# Patient Record
Sex: Female | Born: 1965 | Race: Black or African American | Hispanic: No | Marital: Single | Smoking: Never smoker
Health system: Southern US, Community
[De-identification: ages and names within clinical notes are randomized; demographics above are authoritative.]

## PROBLEM LIST (undated history)

## (undated) DIAGNOSIS — R42 Dizziness and giddiness: Secondary | ICD-10-CM

## (undated) DIAGNOSIS — R55 Syncope and collapse: Secondary | ICD-10-CM

## (undated) DIAGNOSIS — M542 Cervicalgia: Secondary | ICD-10-CM

## (undated) DIAGNOSIS — R1310 Dysphagia, unspecified: Secondary | ICD-10-CM

## (undated) DIAGNOSIS — R109 Unspecified abdominal pain: Secondary | ICD-10-CM

## (undated) DIAGNOSIS — R61 Generalized hyperhidrosis: Secondary | ICD-10-CM

## (undated) DIAGNOSIS — R0600 Dyspnea, unspecified: Secondary | ICD-10-CM

## (undated) DIAGNOSIS — H538 Other visual disturbances: Secondary | ICD-10-CM

## (undated) DIAGNOSIS — U071 COVID-19: Secondary | ICD-10-CM

## (undated) DIAGNOSIS — R002 Palpitations: Secondary | ICD-10-CM

## (undated) DIAGNOSIS — R457 State of emotional shock and stress, unspecified: Secondary | ICD-10-CM

## (undated) DIAGNOSIS — J45909 Unspecified asthma, uncomplicated: Secondary | ICD-10-CM

## (undated) HISTORY — DX: Dyspnea, unspecified: R06.00

## (undated) HISTORY — DX: Generalized hyperhidrosis: R61

## (undated) HISTORY — DX: Unspecified asthma, uncomplicated: J45.909

## (undated) HISTORY — DX: Unspecified abdominal pain: R10.9

## (undated) HISTORY — DX: Syncope and collapse: R55

## (undated) HISTORY — DX: Dysphagia, unspecified: R13.10

## (undated) HISTORY — DX: State of emotional shock and stress, unspecified: R45.7

## (undated) HISTORY — DX: Palpitations: R00.2

## (undated) HISTORY — DX: Cervicalgia: M54.2

## (undated) HISTORY — DX: COVID-19: U07.1

## (undated) HISTORY — DX: Dizziness and giddiness: R42

## (undated) HISTORY — DX: Other visual disturbances: H53.8

---

## 2013-09-29 DIAGNOSIS — N3281 Overactive bladder: Secondary | ICD-10-CM | POA: Insufficient documentation

## 2019-10-05 DIAGNOSIS — R87810 Cervical high risk human papillomavirus (HPV) DNA test positive: Secondary | ICD-10-CM | POA: Insufficient documentation

## 2020-07-18 NOTE — Progress Notes (Deleted)
Cardiology Office Note:    Date:  07/18/2020   ID:  Heidi Mann, DOB July 10, 1965, MRN [id number]  PCP:  No primary care provider on file.   [facility]  Cardiologist:  No primary care provider on file. *** Advanced Practice Provider:  No care team member to display Electrophysiologist:  None    Referring MD: Burnice Logan, PA     History of Present Illness:    Heidi Mann is a 55 y.o. 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  No past medical history on file.  *** The histories are not reviewed yet. Please review them in the "History" navigator section and refresh this SmartLink.  Current Medications: No outpatient medications have been marked as taking for the 07/20/20 encounter (Appointment) with Meriam Sprague, MD.     Allergies:   Patient has no allergy information on record.   Social History   Socioeconomic History  . Marital status: Not on file    Spouse name: Not on file  . Number of children: Not on file  . Years of education: Not on file  . Highest education level: Not on file  Occupational History  . Not on file  Tobacco Use  . Smoking status: Not on file  . Smokeless tobacco: Not on file  Substance and Sexual Activity  . Alcohol use: Not on file  . Drug use: Not on file  . Sexual activity: Not on file  Other Topics Concern  . Not on file  Social History Narrative  . Not on file   Social Determinants of Health   Financial Resource Strain: Not on file  Food Insecurity: Not on file  Transportation Needs: Not on file  Physical Activity: Not on file  Stress: Not on file  Social Connections: Not on file     Family History: The patient's ***family history is not on file.  ROS:   Please see the history of present illness.    *** All other systems reviewed and are negative.  EKGs/Labs/Other Studies Reviewed:    The following studies were reviewed  today: ***  EKG:  EKG is *** ordered today.  The ekg ordered today demonstrates ***  Recent Labs: No results found for requested labs within last 8760 hours.  Recent Lipid Panel No results found for: CHOL, TRIG, HDL, CHOLHDL, VLDL, LDLCALC, LDLDIRECT   Risk Assessment/Calculations:   {Does this patient have ATRIAL FIBRILLATION?[phone]}   Physical Exam:    VS:  There were no vitals taken for this visit.    Wt Readings from Last 3 Encounters:  No data found for Wt     GEN: *** Well nourished, well developed in no acute distress HEENT: Normal NECK: No JVD; No carotid bruits LYMPHATICS: No lymphadenopathy CARDIAC: ***RRR, no murmurs, rubs, gallops RESPIRATORY:  Clear to auscultation without rales, wheezing or rhonchi  ABDOMEN: Soft, non-tender, non-distended MUSCULOSKELETAL:  No edema; No deformity  SKIN: Warm and dry NEUROLOGIC:  Alert and oriented x 3 PSYCHIATRIC:  Normal affect   ASSESSMENT:    No diagnosis found. PLAN:    In order of problems listed above:  #Aortic Ectasia:   {Are you ordering a CV Procedure (e.g. stress test, cath, DCCV, TEE, etc)?   Press F2        :[id number]}    Medication Adjustments/Labs and Tests Ordered: Current medicines are reviewed at length with the patient today.  Concerns regarding medicines are  outlined above.  No orders of the defined types were placed in this encounter.  No orders of the defined types were placed in this encounter.   There are no Patient Instructions on file for this visit.   Signed, Meriam Sprague, MD  07/18/2020 5:40 PM    [facility]

## 2020-07-20 ENCOUNTER — Ambulatory Visit: Payer: 59 | Admitting: Cardiology

## 2020-08-01 ENCOUNTER — Telehealth: Payer: Self-pay

## 2020-08-01 NOTE — Progress Notes (Deleted)
Cardiology Office Note:    Date:  08/01/2020   ID:  Heidi Mann, DOB 08-Mar-1966, MRN [id number]  PCP:  No primary care provider on file.   [facility]  Cardiologist:  No primary care provider on file. *** Advanced Practice Provider:  No care team member to display Electrophysiologist:  None  {Press F2 to show EP APP, CHF, sleep or structural heart MD               :[id number]}  { Click here to update then REFRESH NOTE - MD (PCP) or APP (Team Member)  Change PCP Type for MD, Specialty for APP is either Cardiology or Clinical Cardiac Electrophysiology  :[id number]}   Referring MD: Burnice Logan, PA   No chief complaint on file. ***  History of Present Illness:    Heidi Mann is a 55 y.o. 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  No past medical history on file.  *** The histories are not reviewed yet. Please review them in the "History" navigator section and refresh this SmartLink.  Current Medications: No outpatient medications have been marked as taking for the 08/03/20 encounter (Appointment) with Meriam Sprague, MD.     Allergies:   Patient has no allergy information on record.   Social History   Socioeconomic History  . Marital status: Not on file    Spouse name: Not on file  . Number of children: Not on file  . Years of education: Not on file  . Highest education level: Not on file  Occupational History  . Not on file  Tobacco Use  . Smoking status: Not on file  . Smokeless tobacco: Not on file  Substance and Sexual Activity  . Alcohol use: Not on file  . Drug use: Not on file  . Sexual activity: Not on file  Other Topics Concern  . Not on file  Social History Narrative  . Not on file   Social Determinants of Health   Financial Resource Strain: Not on file  Food Insecurity: Not on file  Transportation Needs: Not on file  Physical Activity: Not on file  Stress: Not  on file  Social Connections: Not on file     Family History: The patient's ***family history is not on file.  ROS:   Please see the history of present illness.    *** All other systems reviewed and are negative.  EKGs/Labs/Other Studies Reviewed:    The following studies were reviewed today: ***  EKG:  EKG is *** ordered today.  The ekg ordered today demonstrates ***  Recent Labs: No results found for requested labs within last 8760 hours.  Recent Lipid Panel No results found for: CHOL, TRIG, HDL, CHOLHDL, VLDL, LDLCALC, LDLDIRECT   Risk Assessment/Calculations:   {Does this patient have ATRIAL FIBRILLATION?:[phone]}   Physical Exam:    VS:  There were no vitals taken for this visit.    Wt Readings from Last 3 Encounters:  No data found for Wt     GEN: *** Well nourished, well developed in no acute distress HEENT: Normal NECK: No JVD; No carotid bruits LYMPHATICS: No lymphadenopathy CARDIAC: ***RRR, no murmurs, rubs, gallops RESPIRATORY:  Clear to auscultation without rales, wheezing or rhonchi  ABDOMEN: Soft, non-tender, non-distended MUSCULOSKELETAL:  No edema; No deformity  SKIN: Warm and dry NEUROLOGIC:  Alert and oriented x 3 PSYCHIATRIC:  Normal affect   ASSESSMENT:  No diagnosis found. PLAN:    In order of problems listed above:   #Aortic Ectasia:   {Are you ordering a CV Procedure (e.g. stress test, cath, DCCV, TEE, etc)?   Press F2        :[id number]}    Medication Adjustments/Labs and Tests Ordered: Current medicines are reviewed at length with the patient today.  Concerns regarding medicines are outlined above.  No orders of the defined types were placed in this encounter.  No orders of the defined types were placed in this encounter.   There are no Patient Instructions on file for this visit.   Signed, Meriam Sprague, MD  08/01/2020 8:38 PM    [facility]

## 2020-08-03 ENCOUNTER — Ambulatory Visit: Payer: 59 | Admitting: Cardiology

## 2020-08-06 NOTE — Telephone Encounter (Signed)
ERROR

## 2020-09-01 NOTE — Progress Notes (Deleted)
Cardiology Office Note:    Date:  09/01/2020   ID:  [name], DOB 1966/02/21, MRN [id number]  PCP:  No primary care provider on file.   [facility]  Cardiologist:  No primary care provider on file.  Advanced Practice Provider:  No care team member to display Electrophysiologist:  None    Referring MD: Burnice Logan, PA     History of Present Illness:    Heidi Mann is a 55 y.o. female with a hx of asthma who was referred by Burnice Logan, PA for evaluation of chest pain.  The patient was seen by PCP on 06/18/20 where she was complaining of substernal chest pressure that feels like indigestion. This had been ongoing since suffering from COVID in 04/2020. Symptoms were nonexertional. CXR obtained showed normal cardiac silhouette, but noted moderate aortic ectasia. TTE 06/06/20 showed LVEF 70%, normal LV wall thickness, normal aortic root size of 2.9cm, normal RV size. Coronary calcium score 0.   Past Medical History:  Diagnosis Date  . Abdominal pain   . Asthma   . Blurred vision   . COVID   . Diaphoresis   . Dizziness   . Dysphagia   . Dyspnea   . Emotional stress   . Neck pain   . Palpitations   . Syncope     *** The histories are not reviewed yet. Please review them in the "History" navigator section and refresh this SmartLink.  Current Medications: No outpatient medications have been marked as taking for the 09/07/20 encounter (Appointment) with Meriam Sprague, MD.     Allergies:   Patient has no known allergies.   Social History   Socioeconomic History  . Marital status: Not on file    Spouse name: Not on file  . Number of children: Not on file  . Years of education: Not on file  . Highest education level: Not on file  Occupational History  . Not on file  Tobacco Use  . Smoking status: Never Smoker  . Smokeless tobacco: Never Used  Substance and Sexual Activity  . Alcohol use: Yes  . Drug use: Never  . Sexual activity:  Not on file  Other Topics Concern  . Not on file  Social History Narrative  . Not on file   Social Determinants of Health   Financial Resource Strain: Not on file  Food Insecurity: Not on file  Transportation Needs: Not on file  Physical Activity: Not on file  Stress: Not on file  Social Connections: Not on file     Family History: The patient's ***family history includes CVA in her mother; Diabetes in her mother; Heart attack in her mother; Hyperlipidemia in her mother; Hypertension in her mother.  ROS:   Please see the history of present illness.    *** All other systems reviewed and are negative.  EKGs/Labs/Other Studies Reviewed:    The following studies were reviewed today: ***  EKG:  EKG is *** ordered today.  The ekg ordered today demonstrates ***  Recent Labs: No results found for requested labs within last 8760 hours.  Recent Lipid Panel No results found for: CHOL, TRIG, HDL, CHOLHDL, VLDL, LDLCALC, LDLDIRECT   Risk Assessment/Calculations:   {Does this patient have ATRIAL FIBRILLATION?[phone]}   Physical Exam:    VS:  There were no vitals taken for this visit.    Wt Readings from Last 3 Encounters:  No data found for Wt     GEN: *** Well nourished,  well developed in no acute distress HEENT: Normal NECK: No JVD; No carotid bruits LYMPHATICS: No lymphadenopathy CARDIAC: ***RRR, no murmurs, rubs, gallops RESPIRATORY:  Clear to auscultation without rales, wheezing or rhonchi  ABDOMEN: Soft, non-tender, non-distended MUSCULOSKELETAL:  No edema; No deformity  SKIN: Warm and dry NEUROLOGIC:  Alert and oriented x 3 PSYCHIATRIC:  Normal affect   ASSESSMENT:    No diagnosis found. PLAN:    In order of problems listed above:  #Aortic Ectasia: Noted on CXR. TTE at OSH with normal LVEF, no significant valve disease, normal aortic root measuring 2.9cm. Calcium score 0 on CT. -Check CTA of aorta  #Chest Pain: Likely secondary to recent COVID  infection in 04/2020. TTE with normal BiV function and calcium score 0. Do not suspect cardiac etiology of symptoms. -Continue symptomatic management  {Are you ordering a CV Procedure (e.g. stress test, cath, DCCV, TEE, etc)?   Press F2        :[id number]}    Medication Adjustments/Labs and Tests Ordered: Current medicines are reviewed at length with the patient today.  Concerns regarding medicines are outlined above.  No orders of the defined types were placed in this encounter.  No orders of the defined types were placed in this encounter.   There are no Patient Instructions on file for this visit.   Signed, Meriam Sprague, MD  09/01/2020 9:06 PM    [facility]

## 2020-09-07 ENCOUNTER — Encounter: Payer: Self-pay | Admitting: Cardiology

## 2020-09-07 ENCOUNTER — Other Ambulatory Visit: Payer: Self-pay

## 2020-09-07 ENCOUNTER — Ambulatory Visit: Payer: 59 | Admitting: Cardiology

## 2020-09-07 VITALS — BP 142/88 | HR 78 | Ht 65.0 in | Wt 178.4 lb

## 2020-09-07 DIAGNOSIS — E785 Hyperlipidemia, unspecified: Secondary | ICD-10-CM | POA: Diagnosis not present

## 2020-09-07 DIAGNOSIS — I10 Essential (primary) hypertension: Secondary | ICD-10-CM

## 2020-09-07 DIAGNOSIS — R29898 Other symptoms and signs involving the musculoskeletal system: Secondary | ICD-10-CM | POA: Diagnosis not present

## 2020-09-07 DIAGNOSIS — Z0189 Encounter for other specified special examinations: Secondary | ICD-10-CM | POA: Diagnosis not present

## 2020-09-07 DIAGNOSIS — I77819 Aortic ectasia, unspecified site: Secondary | ICD-10-CM | POA: Diagnosis not present

## 2020-09-07 LAB — BASIC METABOLIC PANEL
BUN/Creatinine Ratio: 18 (ref 9–23)
BUN: 17 mg/dL (ref 6–24)
CO2: 25 mmol/L (ref 20–29)
Calcium: 10.3 mg/dL — ABNORMAL HIGH (ref 8.7–10.2)
Chloride: 103 mmol/L (ref 96–106)
Creatinine, Ser: 0.97 mg/dL (ref 0.57–1.00)
Glucose: 96 mg/dL (ref 65–99)
Potassium: 4.4 mmol/L (ref 3.5–5.2)
Sodium: 143 mmol/L (ref 134–144)
eGFR: 70 mL/min/{1.73_m2} (ref >59)

## 2020-09-07 MED ORDER — ATORVASTATIN CALCIUM 10 MG PO TABS: 10.0000 mg | ORAL_TABLET | Freq: Every day | ORAL | 2 refills | Status: AC

## 2020-09-07 NOTE — Progress Notes (Signed)
Cardiology Office Note:    Date:  09/07/2020   ID:  Heidi Mann, DOB 1965/10/06, MRN [id number]  PCP:  No primary care provider on file.   [facility]  Cardiologist:  No primary care provider on file.  Advanced Practice Provider:  No care team member to display Electrophysiologist:  None     Referring MD: Burnice Logan, PA     History of Present Illness:    Heidi Mann is a 55 y.o. female with a hx of asthma who was referred by Burnice Logan, PA for evaluation of aortic ectasia noted on CXR.  The patient was seen by PCP on 06/18/20 where she was complaining of substernal chest pressure that feels like indigestion. This had been ongoing since suffering from COVID in 04/2020. Symptoms were nonexertional. CXR obtained showed normal cardiac silhouette, but noted moderate aortic ectasia. TTE 06/06/20 showed LVEF 70%, normal LV wall thickness, normal aortic root size of 2.9cm, normal RV size. Coronary calcium score 0.   Today, she presents with constant chest tightness and discomfort across her upper chest which has been ongoing since COVID. Her cardiac work-up has been reassuring as detailed above and her symptoms continue to be constant and non-exertional in nature. She is concerned about the aortic ectasia noted on CXR as detailed above. Denies any dyspnea, lightheadedness, syncope, lower extremity edema, or palpitations. No orthopnea or PND. No family history of aortopathies or dissections. Blood pressure well controlled at home.  The patient is also concerned that about 3-4 months ago, she had an episode of profound left arm weakness while sitting and eating at a restaurant such that she could not hoist herself out of the booth. No associated symptoms or tingling in the arm. No prior personal history of stroke. Her friend offered to call EMS, but she declined. She sat in the booth for about after which the symptoms resolved and have no recurred. She is  worried about possible TIA given her mother had a stroke several years back.  Family History: Her mother had high blood pressure and a history of stroke. She died approximately 6 years ago unsure of cause. Her brother also has high blood pressure.  Past Medical History:  Diagnosis Date  . Abdominal pain   . Asthma   . Blurred vision   . COVID   . Diaphoresis   . Dizziness   . Dysphagia   . Dyspnea   . Emotional stress   . Neck pain   . Palpitations   . Syncope     History reviewed. No pertinent surgical history.  Current Medications: Current Meds  Medication Sig  . ascorbic acid (VITAMIN C) 250 MG CHEW Chew 250 mg by mouth daily.  . cyclobenzaprine (FLEXERIL) 5 MG tablet Take 5 mg by mouth 3 (three) times daily as needed for muscle spasms.  . hydrocortisone 2.5 % cream Apply topically.  Marland Kitchen lisinopril (ZESTRIL) 5 MG tablet Take 5 mg by mouth daily.  . montelukast (SINGULAIR) 10 MG tablet   . SYMBICORT 160-4.5 MCG/ACT inhaler SMARTSIG:2 Puff(s) By Mouth Twice Daily  . Vitamin D, Ergocalciferol, (DRISDOL) 1.25 MG (50000 UNIT) CAPS capsule Take 50,000 Units by mouth every 7 (seven) days.  . [DISCONTINUED] atorvastatin (LIPITOR) 10 MG tablet Take 10 mg by mouth daily.     Allergies:   Iodine   Social History   Socioeconomic History  . Marital status: Unknown    Spouse name: Not on file  . Number of children: Not  on file  . Years of education: Not on file  . Highest education level: Not on file  Occupational History  . Not on file  Tobacco Use  . Smoking status: Never Smoker  . Smokeless tobacco: Never Used  Substance and Sexual Activity  . Alcohol use: Yes  . Drug use: Never  . Sexual activity: Not on file  Other Topics Concern  . Not on file  Social History Narrative  . Not on file   Social Determinants of Health   Financial Resource Strain: Not on file  Food Insecurity: Not on file  Transportation Needs: Not on file  Physical Activity: Not on file   Stress: Not on file  Social Connections: Not on file     Family History: The patient's family history includes CVA in her mother; Diabetes in her mother; Heart attack in her mother; Hyperlipidemia in her mother; Hypertension in her mother.  ROS:   Please see the history of present illness.    Review of Systems  Constitutional: Negative for malaise/fatigue.  HENT: Negative for hearing loss.   Eyes: Negative for blurred vision.  Respiratory: Negative for shortness of breath.   Cardiovascular: Positive for chest pain.  Gastrointestinal: Negative for heartburn.  Genitourinary: Negative.  Negative for dysuria.  Musculoskeletal: Negative for myalgias.  Neurological: Positive for weakness. Negative for dizziness.  Psychiatric/Behavioral: The patient is nervous/anxious.      EKGs/Labs/Other Studies Reviewed:    The following studies were reviewed today: OSH records reviewed: CXR obtained showed normal cardiac silhouette, but noted moderate aortic ectasia. TTE 06/06/20 showed LVEF 70%, normal LV wall thickness, normal aortic root size of 2.9cm, normal RV size. Coronary calcium score 0.   EKG:   09/07/20: NSR, Rate: 78 bpm, t wave flattening v4-v6, TWI III, aVF   Recent Labs: No results found for requested labs within last 8760 hours.  Recent Lipid Panel No results found for: CHOL, TRIG, HDL, CHOLHDL, VLDL, LDLCALC, LDLDIRECT    Physical Exam:    VS:  BP (!) 142/88   Pulse 78   Ht 5\' 5"  (1.651 m)   Wt 178 lb 6.4 oz (80.9 kg)   SpO2 96%   BMI 29.69 kg/m     Wt Readings from Last 3 Encounters:  09/07/20 178 lb 6.4 oz (80.9 kg)     GEN: Well nourished, well developed in no acute distress HEENT: Normal NECK: No JVD; No carotid bruits CARDIAC: RRR, no murmurs, rubs, gallops RESPIRATORY:  Clear to auscultation without rales, wheezing or rhonchi  ABDOMEN: Soft, non-tender, non-distended MUSCULOSKELETAL:  No edema; No deformity  SKIN: Warm and dry NEUROLOGIC:  Alert and  oriented x 3 PSYCHIATRIC:  Normal affect   ASSESSMENT:    1. Ectatic aorta (HCC)   2. Left arm weakness   3. Encounter for laboratory examination   4. Hyperlipidemia, unspecified hyperlipidemia type   5. Primary hypertension    PLAN:    In order of problems listed above:  #Aortic Ectasia: Noted on CXR. TTE at OSH with normal LVEF, no significant valve disease, normal aortic root measuring 2.9cm. Calcium score 0 on CT. Will check MRA chest for further evaluation. -Check MRA of aorta -Continue blood pressure control  #Chest Pain: #Prolonged COVID syndrome: Likely secondary to recent COVID infection in 04/2020. TTE with normal BiV function and calcium score 0. Do not suspect cardiac etiology of symptoms. -Continue symptomatic management of prolonged COVID syndrome  #Transient left arm weakness: Patient with episode of transient left arm weakness  when trying to boost herself out of a booth. She had to wait before symptoms resolved. Did not see physician at that time. Symptoms have not recurred. No known prior CVA or TIA but patient is concerned this may have been a TIA given her family history of stroke in her mom. Will refer to Neuro per patient request. -Referral to Neuro per patient request -Continue lipitor -Cardiac work-up reassuring with normal echo and coronary calcium score  #HTN: Well controlled at home. -Continue lisinopril 5mg  daily  #HLD: -Continue lipitor 10mg  daily -Managed by PCP    Medication Adjustments/Labs and Tests Ordered: Current medicines are reviewed at length with the patient today.  Concerns regarding medicines are outlined above.  Orders Placed This Encounter  Procedures  . MR Angiogram Chest W Wo Contrast  . Basic metabolic panel  . Ambulatory referral to Neurology  . EKG 12-Lead   Meds ordered this encounter  Medications  . atorvastatin (LIPITOR) 10 MG tablet    Sig: Take 1 tablet (10 mg total) by mouth daily.    Dispense:  90  tablet    Refill:  2    Patient Instructions  Medication Instructions:   Your physician recommends that you continue on your current medications as directed. Please refer to the Current Medication list given to you today.  *If you need a refill on your cardiac medications before your next appointment, please call your pharmacy*   Lab Work:  TODAY--BMET  If you have labs (blood work) drawn today and your tests are completely normal, you will receive your results only by: MyChart Message (if you have MyChart) OR . A paper copy in the mail If you have any lab test that is abnormal or we need to change your treatment, we will call you to review the results.    You have been referred to [facility] NEUROLOGY FOR TRANSIENT LEFT ARM WEAKNESS    Testing/Procedures:  MRA OF THE CHEST WITH/WITHOUT CONTRAST TO ASSESS ECTATIC AORTA   Follow-Up:  AS NEEDED IN THE OFFICE WITH DR. Marland Kitchen       Follow-up as needed.  I,Mathew Stumpf,acting as a [facility] for Shari Prows, MD.,have documented all relevant documentation on the behalf of Neurosurgeon, MD,as directed by  Meriam Sprague, MD while in the presence of Meriam Sprague, MD.  I, Meriam Sprague, MD, have reviewed all documentation for this visit. The documentation on 09/07/20 for the exam, diagnosis, procedures, and orders are all accurate and complete.  Signed, Meriam Sprague, MD  09/07/2020 10:18 AM    [facility]

## 2020-09-07 NOTE — Patient Instructions (Signed)
Medication Instructions:   Your physician recommends that you continue on your current medications as directed. Please refer to the Current Medication list given to you today.  *If you need a refill on your cardiac medications before your next appointment, please call your pharmacy*   Lab Work:  TODAY--BMET  If you have labs (blood work) drawn today and your tests are completely normal, you will receive your results only by: Marland Kitchen MyChart Message (if you have MyChart) OR . A paper copy in the mail If you have any lab test that is abnormal or we need to change your treatment, we will call you to review the results.    You have been referred to [facility] NEUROLOGY FOR TRANSIENT LEFT ARM WEAKNESS    Testing/Procedures:  MRA OF THE CHEST WITH/WITHOUT CONTRAST TO ASSESS ECTATIC AORTA   Follow-Up:  AS NEEDED IN THE OFFICE WITH DR. Shari Prows

## 2020-09-11 ENCOUNTER — Encounter: Payer: Self-pay | Admitting: Neurology

## 2020-09-21 ENCOUNTER — Ambulatory Visit (HOSPITAL_COMMUNITY): Payer: 59

## 2020-10-08 ENCOUNTER — Ambulatory Visit (HOSPITAL_COMMUNITY): Admission: RE | Admit: 2020-10-08 | Payer: 59 | Source: Ambulatory Visit

## 2020-11-26 ENCOUNTER — Ambulatory Visit: Payer: 59 | Admitting: Neurology
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
## 2021-01-03 ENCOUNTER — Encounter: Payer: Self-pay | Admitting: Neurology

## 2021-01-03 ENCOUNTER — Other Ambulatory Visit: Payer: Self-pay

## 2021-01-03 ENCOUNTER — Ambulatory Visit: Payer: 59 | Admitting: Neurology

## 2021-01-03 VITALS — BP 163/93 | HR 80 | Ht 65.0 in | Wt 185.0 lb

## 2021-01-03 DIAGNOSIS — R29898 Other symptoms and signs involving the musculoskeletal system: Secondary | ICD-10-CM | POA: Diagnosis not present

## 2021-01-03 DIAGNOSIS — R29818 Other symptoms and signs involving the nervous system: Secondary | ICD-10-CM

## 2021-01-03 NOTE — Patient Instructions (Addendum)
MRI brain without contrast MRA head US carotids  We will call with the results

## 2021-01-14 ENCOUNTER — Ambulatory Visit
Admission: RE | Admit: 2021-01-14 | Discharge: 2021-01-14 | Disposition: A | Discharge status: Home / Self Care | Payer: 59 | Source: Ambulatory Visit | Attending: Neurology | Admitting: Neurology

## 2021-01-14 ENCOUNTER — Other Ambulatory Visit: Payer: Self-pay

## 2021-01-14 DIAGNOSIS — R29818 Other symptoms and signs involving the nervous system: Secondary | ICD-10-CM

## 2021-01-14 DIAGNOSIS — R29898 Other symptoms and signs involving the musculoskeletal system: Secondary | ICD-10-CM
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
## 2021-01-14 IMAGING — US US CAROTID DUPLEX BILAT
1 series · 13 of 24 positions shown · non-contrast
Comparison: None.

CLINICAL DATA: Transient neurological symptoms, arm

EXAM:
BILATERAL CAROTID DUPLEX ULTRASOUND
TECHNIQUE: Gray scale imaging, color Doppler and duplex ultrasound were
performed of bilateral carotid and vertebral arteries in the neck.

[Series 1: us carotid duplex bilat · 0.06mm/px · 13 of 62 slices shown]
[im 1/62]
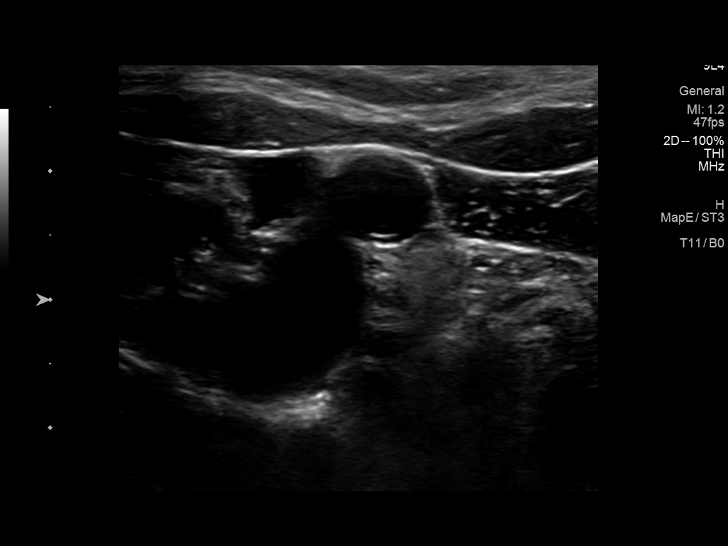
[im 6/62]
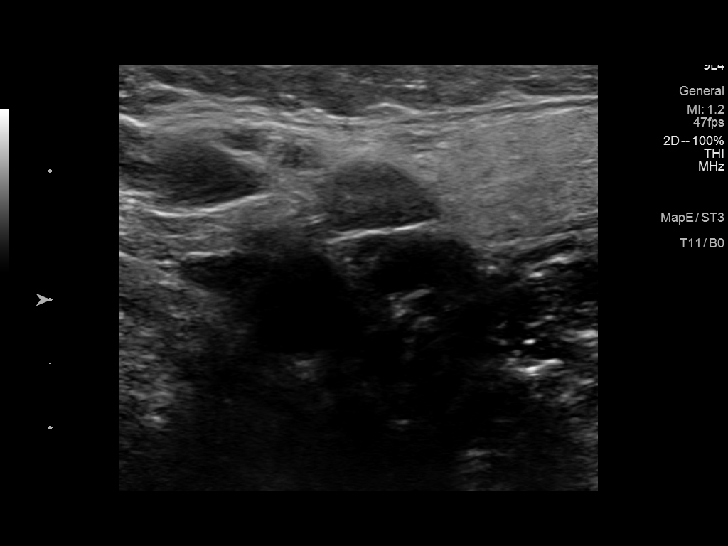
[im 11/62]
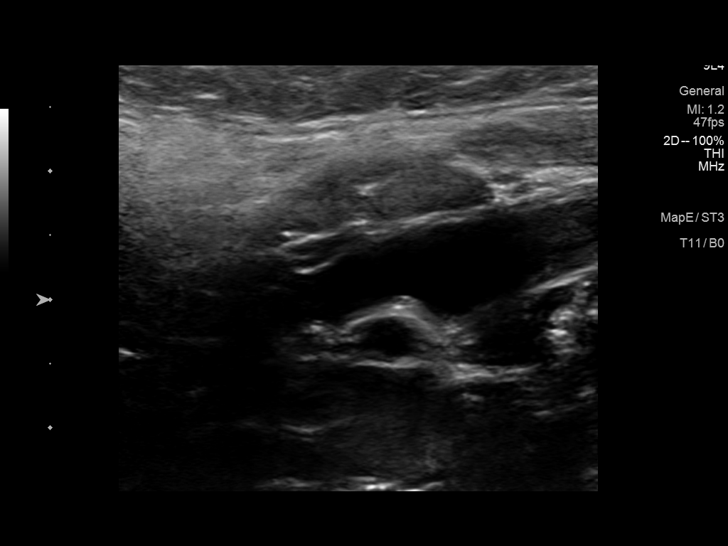
[im 16/62]
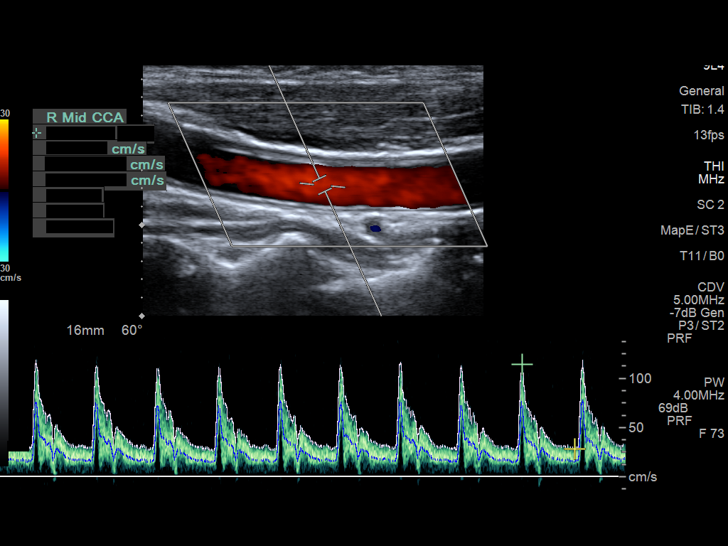
[im 22/62]
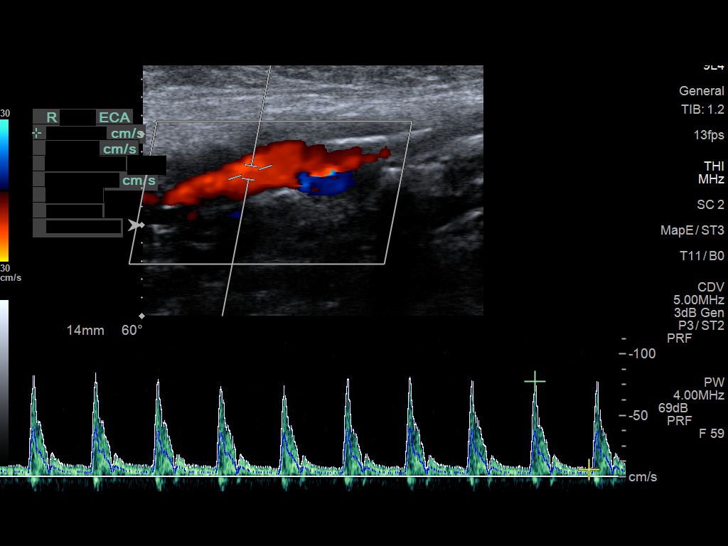
[im 27/62]
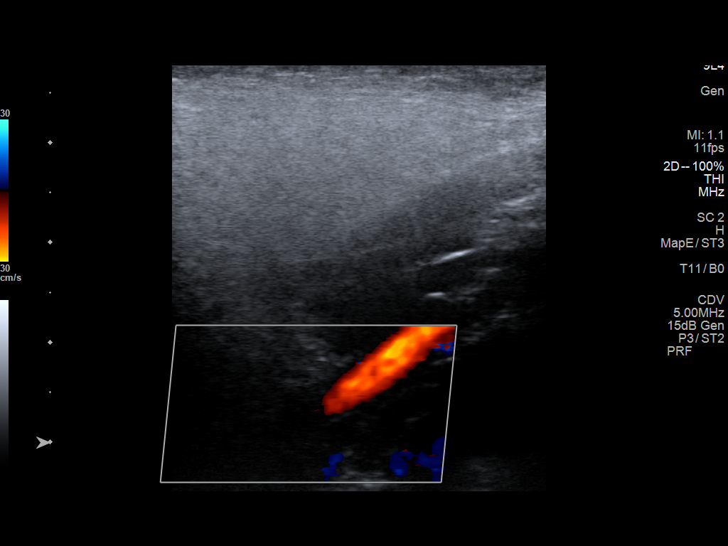
[im 32/62]
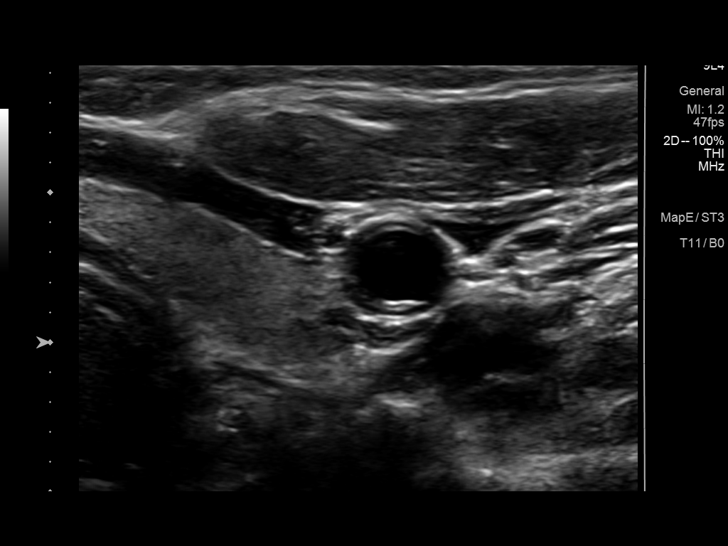
[im 35/62]
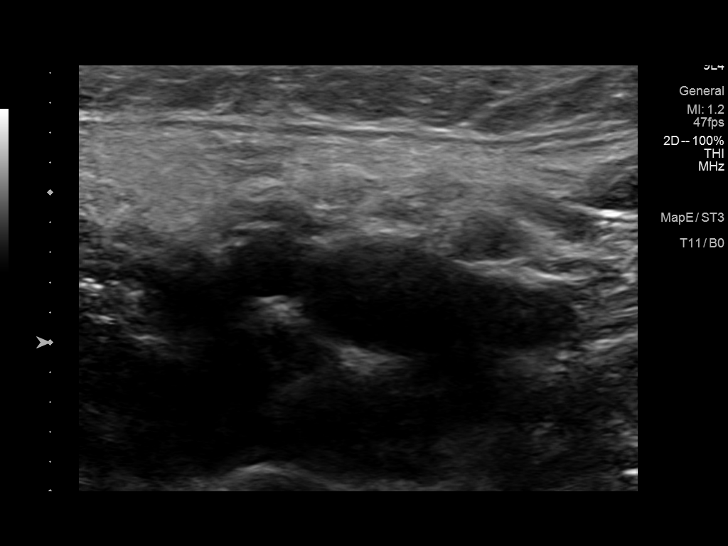
[im 40/62]
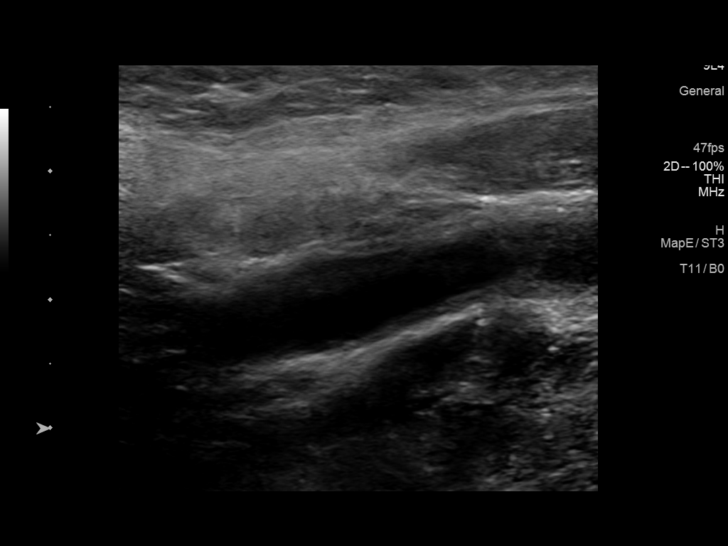
[im 46/62]
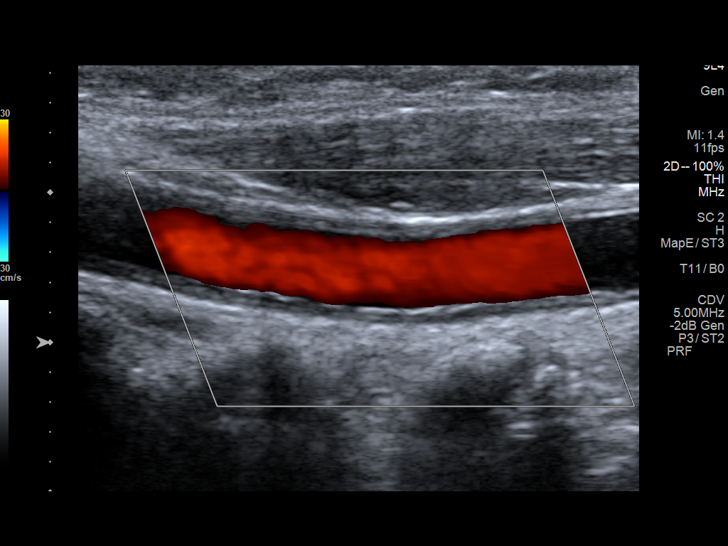
[im 51/62]
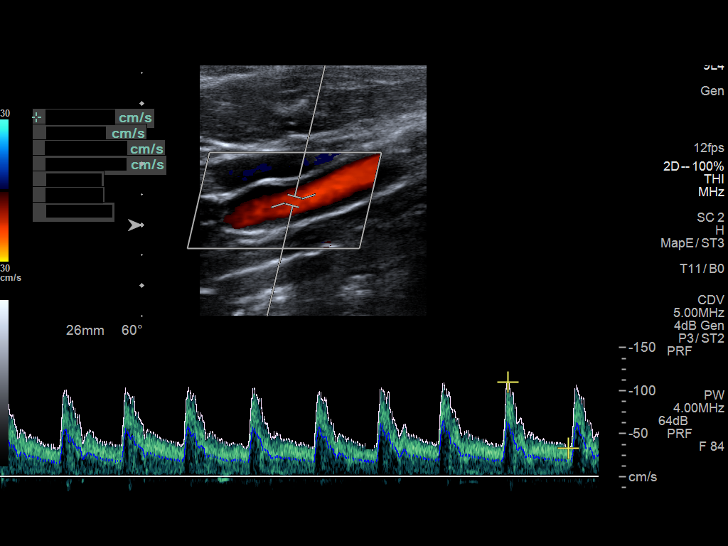
[im 56/62]
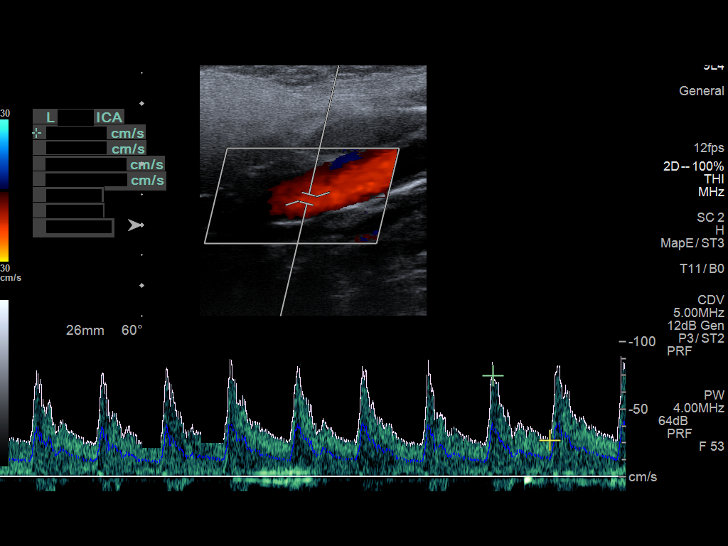
[im 62/62]
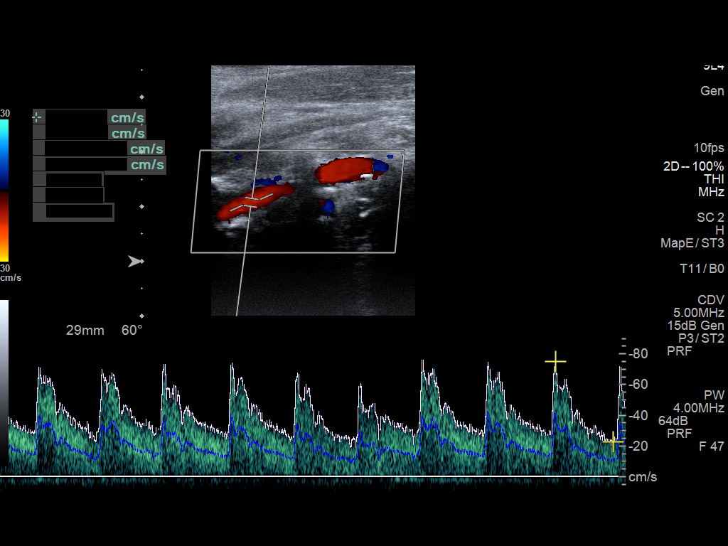

[13 of 24 positions shown; findings below may reference images not displayed]

FINDINGS: Criteria: Quantification of carotid stenosis is based on velocity
parameters that correlate the residual internal carotid diameter
with NASCET-based stenosis levels, using the diameter of the distal
internal carotid lumen as the denominator for stenosis measurement.

The following velocity measurements were obtained:

RIGHT
ICA: 97/45 cm/sec
CCA: 114/20 cm/sec

SYSTOLIC ICA/CCA RATIO:

ECA:  78 cm/sec

LEFT

ICA: 97/40 cm/sec

CCA: 144/30 cm/sec

SYSTOLIC ICA/CCA RATIO:

ECA:  71 cm/sec

RIGHT CAROTID ARTERY: Mild smooth heterogeneous atherosclerotic
plaque in the proximal internal carotid artery. By peak systolic
velocity criteria, stenosis is less than 50%.

RIGHT VERTEBRAL ARTERY:  Patent with normal antegrade flow.

LEFT CAROTID ARTERY: Mild heterogeneous atherosclerotic plaque in
the proximal internal carotid artery. By peak systolic velocity
criteria, the estimated stenosis is less than 50%.

LEFT VERTEBRAL ARTERY:  Patent with normal antegrade flow.
IMPRESSION: 1. Mild (1-49%) stenosis proximal right internal carotid artery
secondary to mild smooth heterogeneous atherosclerotic plaque.
2. Mild (1-49%) stenosis proximal left internal carotid artery
secondary to mild smooth heterogeneous atherosclerotic plaque.
3. Vertebral arteries are patent with normal antegrade flow.

## 2021-01-17 ENCOUNTER — Telehealth: Payer: Self-pay | Admitting: Neurology

## 2021-01-17 NOTE — Telephone Encounter (Signed)
Pt is returning a call about results  

## 2021-01-17 NOTE — Telephone Encounter (Signed)
See Result notes. Called patient again at 12:15 pm and was unable to leave a message.

## 2021-01-22 NOTE — Progress Notes (Signed)
Called patient and informed her of results for MRI and vessel imaging of the head and carotids. Patient verbalized understanding and had no further questions or concerns.

## 2021-01-28 ENCOUNTER — Ambulatory Visit: Payer: 59 | Admitting: Neurology
